# Patient Record
Sex: Female | Born: 2005 | Race: Black or African American | Hispanic: No | Marital: Single | Smoking: Never smoker
Health system: Southern US, Community
[De-identification: ages and names within clinical notes are randomized; demographics above are authoritative.]

## PROBLEM LIST (undated history)

## (undated) DIAGNOSIS — Z789 Other specified health status: Secondary | ICD-10-CM

## (undated) DIAGNOSIS — N63 Unspecified lump in unspecified breast: Secondary | ICD-10-CM

## (undated) HISTORY — PX: NO PAST SURGERIES: SHX2092

---
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
## 2023-08-02 ENCOUNTER — Ambulatory Visit: Payer: No Typology Code available for payment source | Admitting: Family Medicine
